# Patient Record
Sex: Male | Born: 2000 | Race: Black or African American | Hispanic: No | Marital: Single | Smoking: Never smoker
Health system: Southern US, Community
[De-identification: ages and names within clinical notes are randomized; demographics above are authoritative.]

## PROBLEM LIST (undated history)

## (undated) DIAGNOSIS — Z8619 Personal history of other infectious and parasitic diseases: Secondary | ICD-10-CM

## (undated) DIAGNOSIS — I370 Nonrheumatic pulmonary valve stenosis: Secondary | ICD-10-CM

## (undated) DIAGNOSIS — E669 Obesity, unspecified: Secondary | ICD-10-CM

## (undated) DIAGNOSIS — G40A09 Absence epileptic syndrome, not intractable, without status epilepticus: Secondary | ICD-10-CM

## (undated) DIAGNOSIS — R569 Unspecified convulsions: Secondary | ICD-10-CM

## (undated) DIAGNOSIS — J309 Allergic rhinitis, unspecified: Secondary | ICD-10-CM

## (undated) HISTORY — DX: Obesity, unspecified: E66.9

## (undated) HISTORY — DX: Nonrheumatic pulmonary valve stenosis: I37.0

## (undated) HISTORY — DX: Absence epileptic syndrome, not intractable, without status epilepticus: G40.A09

## (undated) HISTORY — DX: Allergic rhinitis, unspecified: J30.9

## (undated) HISTORY — DX: Personal history of other infectious and parasitic diseases: Z86.19

---
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
## 2020-10-07 ENCOUNTER — Ambulatory Visit (HOSPITAL_COMMUNITY)
Admission: EM | Admit: 2020-10-07 | Discharge: 2020-10-07 | Disposition: A | Discharge status: Home / Self Care | Payer: Managed Care, Other (non HMO) | Attending: Family Medicine | Admitting: Family Medicine

## 2020-10-07 ENCOUNTER — Other Ambulatory Visit: Payer: Self-pay

## 2020-10-07 DIAGNOSIS — J069 Acute upper respiratory infection, unspecified: Secondary | ICD-10-CM | POA: Diagnosis not present

## 2020-10-07 DIAGNOSIS — X58XXXA Exposure to other specified factors, initial encounter: Secondary | ICD-10-CM | POA: Diagnosis not present

## 2020-10-07 DIAGNOSIS — Z20822 Contact with and (suspected) exposure to covid-19: Secondary | ICD-10-CM | POA: Insufficient documentation

## 2020-10-07 DIAGNOSIS — S00522A Blister (nonthermal) of oral cavity, initial encounter: Secondary | ICD-10-CM

## 2020-10-07 LAB — CBC WITH DIFFERENTIAL/PLATELET
Abs Immature Granulocytes: 0.01 10*3/uL (ref 0.00–0.07)
Basophils Absolute: 0 10*3/uL (ref 0.0–0.1)
Basophils Relative: 1 %
Eosinophils Absolute: 0 10*3/uL (ref 0.0–0.5)
Eosinophils Relative: 1 %
HCT: 44.8 % (ref 39.0–52.0)
Hemoglobin: 15 g/dL (ref 13.0–17.0)
Immature Granulocytes: 0 %
Lymphocytes Relative: 25 %
Lymphs Abs: 0.9 10*3/uL (ref 0.7–4.0)
MCH: 29.6 pg (ref 26.0–34.0)
MCHC: 33.5 g/dL (ref 30.0–36.0)
MCV: 88.5 fL (ref 80.0–100.0)
Monocytes Absolute: 0.6 10*3/uL (ref 0.1–1.0)
Monocytes Relative: 16 %
Neutro Abs: 2.1 10*3/uL (ref 1.7–7.7)
Neutrophils Relative %: 57 %
Platelets: 159 10*3/uL (ref 150–400)
RBC: 5.06 MIL/uL (ref 4.22–5.81)
RDW: 12.7 % (ref 11.5–15.5)
WBC: 3.7 10*3/uL — ABNORMAL LOW (ref 4.0–10.5)
nRBC: 0 % (ref 0.0–0.2)

## 2020-10-07 LAB — COMPREHENSIVE METABOLIC PANEL
ALT: 14 U/L (ref 0–44)
AST: 21 U/L (ref 15–41)
Albumin: 3.9 g/dL (ref 3.5–5.0)
Alkaline Phosphatase: 34 U/L — ABNORMAL LOW (ref 38–126)
Anion gap: 10 (ref 5–15)
BUN: 8 mg/dL (ref 6–20)
CO2: 27 mmol/L (ref 22–32)
Calcium: 9 mg/dL (ref 8.9–10.3)
Chloride: 96 mmol/L — ABNORMAL LOW (ref 98–111)
Creatinine, Ser: 1.19 mg/dL (ref 0.61–1.24)
GFR, Estimated: >60 mL/min (ref >60)
Glucose, Bld: 86 mg/dL (ref 70–99)
Potassium: 3.9 mmol/L (ref 3.5–5.1)
Sodium: 133 mmol/L — ABNORMAL LOW (ref 135–145)
Total Bilirubin: 0.8 mg/dL (ref 0.3–1.2)
Total Protein: 7.5 g/dL (ref 6.5–8.1)

## 2020-10-07 MED ORDER — LIDOCAINE VISCOUS HCL 2 % MT SOLN: 10.0000 mL | OROMUCOSAL | 0 refills | Status: AC | PRN

## 2020-10-07 MED ORDER — PROMETHAZINE-DM 6.25-15 MG/5ML PO SYRP: 5.0000 mL | ORAL_SOLUTION | Freq: Four times a day (QID) | ORAL | 0 refills | Status: AC | PRN

## 2020-10-07 MED ORDER — MUPIROCIN 2 % EX OINT: 1.0000 "application " | TOPICAL_OINTMENT | Freq: Two times a day (BID) | CUTANEOUS | 0 refills | Status: AC

## 2020-10-07 NOTE — ED Triage Notes (Signed)
Pt is present today with mouth blisters, body aches, and a cough. Pt states that his sx started Saturday. Pt states that he tested negative yesterday morning.

## 2020-10-08 LAB — SARS CORONAVIRUS 2 (TAT 6-24 HRS): SARS Coronavirus 2: NEGATIVE

## 2020-10-09 NOTE — ED Provider Notes (Signed)
[facility]    CSN: [id number] Arrival date & time: 10/07/20  1324      History   Chief Complaint Chief Complaint  Patient presents with  . Cough  . Generalized Body Aches  . Mouth Lesions    HPI Adrian Conner is a 20 y.o. male.   Patient presenting today with 4-day history of body aches, cough, congestion, scratchy throat, now today having blisters from around his mouth that are sore.  He denies chest pain, shortness of breath, abdominal pain, nausea vomiting or diarrhea, headache, dizziness.  Was on vacation in [place] and symptoms started, try to stay well-hydrated, took some over-the-counter cold medication with only mild temporary relief.  COVID test yesterday was negative.  Unaware of any sick contacts that he was around.  No pertinent past medical history.      Past Medical History:  Diagnosis Date  . Allergic rhinitis   . Epilepsy, absence (HCC)   . History of cold sores 2009  . Obesity   . Pulmonic valve stenosis    referred to cardiology for follow up in 2013 due to S4 heart sound.   . Seizures (HCC)    absence epilpesy    Patient Active Problem List   Diagnosis Date Noted  . 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 Acne 06/17/2014  . Seizures (HCC)     No past surgical history on file.     Home Medications    Prior to Admission medications   Medication Sig Start Date End Date Taking? Authorizing Provider  lidocaine (XYLOCAINE) 2 % solution Use as directed 10 mLs in the mouth or throat as needed for mouth pain. 10/07/20  Yes Particia Nearing, PA-C  mupirocin ointment (BACTROBAN) 2 % Apply 1 application topically 2 (two) times daily. 10/07/20  Yes Particia Nearing, PA-C  promethazine-dextromethorphan (PROMETHAZINE-DM) 6.25-15 MG/5ML syrup Take 5 mLs by mouth 4 (four) times daily as needed for cough. 10/07/20  Yes Particia Nearing, PA-C  Cholecalciferol (VITAMIN D3) 2000 units capsule Take by mouth. 09/19/17   [provider]  divalproex (DEPAKOTE ER) 500 MG 24 hr tablet Take by mouth. 09/19/17   [provider]    Family History No family history on file.  Social History Social History   Tobacco Use  . Smoking status: Never Smoker  . Smokeless tobacco: Never Used  Substance Use Topics  . Alcohol use: No  . Drug use: No     Allergies   Patient has no known allergies.   Review of Systems Review of Systems Per HPI  Physical Exam Triage Vital Signs ED Triage Vitals  Enc Vitals Group     BP 10/07/20 1507 136/84     Pulse Rate 10/07/20 1507 87     Resp 10/07/20 1507 18     Temp 10/07/20 1507 99.7 F (37.6 C)     Temp Source 10/07/20 1507 Oral     SpO2 10/07/20 1507 100 %     Weight --      Height --      Head Circumference --      Peak Flow --      Pain Score 10/07/20 1503 0     Pain Loc --      Pain Edu? --      Excl. in GC? --    No data found.  Updated Vital Signs BP 136/84   Pulse 87   Temp 99.7 F (37.6 C) (Oral)   Resp 18  SpO2 100%   Visual Acuity Right Eye Distance:   Left Eye Distance:   Bilateral Distance:    Right Eye Near:   Left Eye Near:    Bilateral Near:     Physical Exam Vitals and nursing note reviewed.  Constitutional:      Appearance: Normal appearance.  HENT:     Head: Atraumatic.     Right Ear: Tympanic membrane normal.     Left Ear: Tympanic membrane normal.     Nose: Rhinorrhea present.     Mouth/Throat:     Mouth: Mucous membranes are moist.     Pharynx: Posterior oropharyngeal erythema present.     Comments: No tonsillar edema or exudates, uvula midline, possibly some inflamed patches on tongue but no obvious lesions on tongue or oral mucosa.  Large areas of papular blistering surrounding lips.  No active drainage Eyes:     Extraocular Movements: Extraocular movements intact.     Conjunctiva/sclera: Conjunctivae normal.  Cardiovascular:     Rate and Rhythm: Normal rate and regular rhythm.  Pulmonary:      Effort: Pulmonary effort is normal. No respiratory distress.     Breath sounds: Normal breath sounds. No wheezing or rales.  Abdominal:     General: Bowel sounds are normal. There is no distension.     Palpations: Abdomen is soft.     Tenderness: There is no abdominal tenderness. There is no right CVA tenderness, left CVA tenderness or guarding.  Musculoskeletal:        General: Normal range of motion.     Cervical back: Normal range of motion and neck supple.  Skin:    General: Skin is warm and dry.     Findings: Rash present.     Comments: Blistering around lips  Neurological:     General: No focal deficit present.     Mental Status: He is oriented to person, place, and time.  Psychiatric:        Mood and Affect: Mood normal.        Thought Content: Thought content normal.        Judgment: Judgment normal.    UC Treatments / Results  Labs (all labs ordered are listed, but only abnormal results are displayed) Labs Reviewed  CBC WITH DIFFERENTIAL/PLATELET - Abnormal; Notable for the following components:      Result Value   WBC 3.7 (*)    All other components within normal limits  COMPREHENSIVE METABOLIC PANEL - Abnormal; Notable for the following components:   Sodium 133 (*)    Chloride 96 (*)    Alkaline Phosphatase 34 (*)    All other components within normal limits  SARS CORONAVIRUS 2 (TAT 6-24 HRS)  RESPIRATORY PANEL BY PCR    EKG   Radiology No results found.  Procedures Procedures (including critical care time)  Medications Ordered in UC Medications - No data to display  Initial Impression / Assessment and Plan / UC Course  I have reviewed the triage vital signs and the nursing notes.  Pertinent labs & imaging results that were available during my care of the patient were reviewed by me and considered in my medical decision making (see chart for details).     Patient requesting significant testing including full panel of blood work, GI and respiratory  conditions.  Discussed some basic blood work, respiratory panel testing but that GI panel was not indicated as his symptoms had not been going on for more than 1 week and  were stable appearing.  Respiratory panel and basic labs pending, discussed trial of viscous lidocaine for oral lesions, mupirocin ointment to the external blistering around lips, Phenergan DM for cough in addition to Mucinex, DayQuil, NyQuil, other supportive medications.  Home care and strict return precautions reviewed.  Discussed isolation protocol.  Final Clinical Impressions(s) / UC Diagnoses   Final diagnoses:  Viral URI with cough  Non-thermal blister of oral cavity, initial encounter   Discharge Instructions   None    ED Prescriptions    Medication Sig Dispense Auth. Provider   promethazine-dextromethorphan (PROMETHAZINE-DM) 6.25-15 MG/5ML syrup Take 5 mLs by mouth 4 (four) times daily as needed for cough. 100 mL Particia Nearing, PA-C   lidocaine (XYLOCAINE) 2 % solution Use as directed 10 mLs in the mouth or throat as needed for mouth pain. 100 mL Particia Nearing, PA-C   mupirocin ointment (BACTROBAN) 2 % Apply 1 application topically 2 (two) times daily. 22 g Particia Nearing, [place]     PDMP not reviewed this encounter.   Roosvelt Maser [place], [place] 10/09/20 [phone]

## 2020-10-10 ENCOUNTER — Ambulatory Visit
Admission: EM | Admit: 2020-10-10 | Discharge: 2020-10-10 | Disposition: A | Discharge status: Home / Self Care | Payer: Managed Care, Other (non HMO) | Attending: Emergency Medicine | Admitting: Emergency Medicine

## 2020-10-10 ENCOUNTER — Telehealth: Payer: Self-pay | Admitting: Emergency Medicine

## 2020-10-10 ENCOUNTER — Encounter: Payer: Self-pay | Admitting: Emergency Medicine

## 2020-10-10 ENCOUNTER — Other Ambulatory Visit: Payer: Self-pay

## 2020-10-10 DIAGNOSIS — K146 Glossodynia: Secondary | ICD-10-CM

## 2020-10-10 DIAGNOSIS — B001 Herpesviral vesicular dermatitis: Secondary | ICD-10-CM

## 2020-10-10 MED ORDER — VALACYCLOVIR HCL 1 G PO TABS: 1000.0000 mg | ORAL_TABLET | Freq: Three times a day (TID) | ORAL | 0 refills | Status: DC

## 2020-10-10 MED ORDER — IBUPROFEN 800 MG PO TABS: 800.0000 mg | ORAL_TABLET | Freq: Three times a day (TID) | ORAL | 0 refills | Status: AC

## 2020-10-10 MED ORDER — IBUPROFEN 800 MG PO TABS: 800.0000 mg | ORAL_TABLET | Freq: Three times a day (TID) | ORAL | 0 refills | Status: DC

## 2020-10-10 MED ORDER — NYSTATIN 100000 UNIT/ML MT SUSP: 10.0000 mL | Freq: Four times a day (QID) | OROMUCOSAL | 0 refills | Status: AC | PRN

## 2020-10-10 NOTE — ED Triage Notes (Signed)
Pt here for mouth blisters  X 5 days that are painful; pt seen on 5/25 for same

## 2020-10-10 NOTE — Discharge Instructions (Addendum)
I suspect the sores in her mouth are likely viral, begin Valtrex 3 times daily over the next 7 to 10 days Please use Magic mouthwash in the meantime to help with discomfort, swish and gargle then spit out Tylenol and ibuprofen for pain Follow-up if not improving or worsening

## 2020-10-10 NOTE — ED Provider Notes (Signed)
[facility]    CSN: [id number] Arrival date & time: 10/10/20  1028      History   Chief Complaint Chief Complaint  Patient presents with  . Blister    HPI Stepfon Rawles is a 20 y.o. male presenting today for evaluation of blisters to tongue.  Reports over the past 5 days has had painful sores to lips and tongue, lesions on lips are improving, but reports persistent pain and discomfort to tongue.  Has had decreased oral intake due to this.  Has been using viscous lidocaine and bacitracin without relief.  Reports history of similar cold sores on lips, but denies lesions on the tongue.  HPI  Past Medical History:  Diagnosis Date  . Allergic rhinitis   . Epilepsy, absence (HCC)   . History of cold sores 2009  . Obesity   . Pulmonic valve stenosis    referred to cardiology for follow up in 2013 due to S4 heart sound.   . Seizures (HCC)    absence epilpesy    Patient Active Problem List   Diagnosis Date Noted  . 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 Acne 06/17/2014  . Seizures (HCC)     History reviewed. No pertinent surgical history.     Home Medications    Prior to Admission medications   Medication Sig Start Date End Date Taking? Authorizing Provider  ibuprofen (ADVIL) 800 MG tablet Take 1 tablet (800 mg total) by mouth 3 (three) times daily. 10/10/20  Yes Adella Manolis C, PA-C  magic mouthwash (nystatin, lidocaine, diphenhydrAMINE, alum & mag hydroxide) suspension Swish and spit 10 mLs 4 (four) times daily as needed for mouth pain. 10/10/20  Yes Ambria Mayfield C, PA-C  valACYclovir (VALTREX) 1000 MG tablet Take 1 tablet (1,000 mg total) by mouth 3 (three) times daily for 10 days. 10/10/20 10/20/20 Yes Reilynn Lauro C, PA-C  Cholecalciferol (VITAMIN D3) 2000 units capsule Take by mouth. 09/19/17   [provider]  divalproex (DEPAKOTE ER) 500 MG 24 hr tablet Take by mouth. 09/19/17   [provider]  lidocaine (XYLOCAINE) 2 % solution  Use as directed 10 mLs in the mouth or throat as needed for mouth pain. 10/07/20   Particia Nearing, PA-C  mupirocin ointment (BACTROBAN) 2 % Apply 1 application topically 2 (two) times daily. 10/07/20   Particia Nearing, PA-C  promethazine-dextromethorphan (PROMETHAZINE-DM) 6.25-15 MG/5ML syrup Take 5 mLs by mouth 4 (four) times daily as needed for cough. 10/07/20   Particia Nearing, PA-C    Family History History reviewed. No pertinent family history.  Social History Social History   Tobacco Use  . Smoking status: Never Smoker  . Smokeless tobacco: Never Used  Substance Use Topics  . Alcohol use: No  . Drug use: No     Allergies   Patient has no known allergies.   Review of Systems Review of Systems  Constitutional: Negative for fatigue and fever.  HENT: Positive for mouth sores.   Eyes: Negative for redness, itching and visual disturbance.  Respiratory: Negative for shortness of breath.   Cardiovascular: Negative for chest pain and leg swelling.  Gastrointestinal: Negative for nausea and vomiting.  Musculoskeletal: Negative for arthralgias and myalgias.  Skin: Positive for color change and rash. Negative for wound.  Neurological: Negative for dizziness, syncope, weakness, light-headedness and headaches.     Physical Exam Triage Vital Signs ED Triage Vitals  Enc Vitals Group     BP      Pulse  Resp      Temp      Temp src      SpO2      Weight      Height      Head Circumference      Peak Flow      Pain Score      Pain Loc      Pain Edu?      Excl. in GC?    No data found.  Updated Vital Signs BP 109/76 (BP Location: Left Arm)   Pulse 66   Temp 98.1 F (36.7 C) (Oral)   Resp 18   SpO2 98%   Visual Acuity Right Eye Distance:   Left Eye Distance:   Bilateral Distance:    Right Eye Near:   Left Eye Near:    Bilateral Near:     Physical Exam Vitals and nursing note reviewed.  Constitutional:      Appearance: He is  well-developed.     Comments: No acute distress  HENT:     Head: Normocephalic and atraumatic.     Nose: Nose normal.     Mouth/Throat:     Comments: Lips with various areas of open sores noted to creases  Tongue with deep erythema, various sores/ulcerative lesions noted and white patches across tongue, other oral mucosa without erythema or white patches Eyes:     Conjunctiva/sclera: Conjunctivae normal.  Cardiovascular:     Rate and Rhythm: Normal rate.  Pulmonary:     Effort: Pulmonary effort is normal. No respiratory distress.  Abdominal:     General: There is no distension.  Musculoskeletal:        General: Normal range of motion.     Cervical back: Neck supple.  Skin:    General: Skin is warm and dry.  Neurological:     Mental Status: He is alert and oriented to person, place, and time.      UC Treatments / Results  Labs (all labs ordered are listed, but only abnormal results are displayed) Labs Reviewed - No data to display  EKG   Radiology No results found.  Procedures Procedures (including critical care time)  Medications Ordered in UC Medications - No data to display  Initial Impression / Assessment and Plan / UC Course  I have reviewed the triage vital signs and the nursing notes.  Pertinent labs & imaging results that were available during my care of the patient were reviewed by me and considered in my medical decision making (see chart for details).     Given sores on lips as well as tongue suspect most likely viral, opting to initiate on Valtrex, providing Magic mouthwash to help with discomfort in the meantime.  Seems less likely suggestive of thrush given no involvement of other oral mucosa and isolated to tongue at this time.  Continue to monitor.  Discussed strict return precautions. Patient verbalized understanding and is agreeable with plan.  Final Clinical Impressions(s) / UC Diagnoses   Final diagnoses:  Tongue sore  Cold sore      Discharge Instructions     I suspect the sores in her mouth are likely viral, begin Valtrex 3 times daily over the next 7 to 10 days Please use Magic mouthwash in the meantime to help with discomfort, swish and gargle then spit out Tylenol and ibuprofen for pain Follow-up if not improving or worsening     ED Prescriptions    Medication Sig Dispense Auth. Provider   valACYclovir (VALTREX)  1000 MG tablet Take 1 tablet (1,000 mg total) by mouth 3 (three) times daily for 10 days. 30 tablet Brittony Billick, [place] C, PA-C   magic mouthwash (nystatin, lidocaine, diphenhydrAMINE, alum & mag hydroxide) suspension Swish and spit 10 mLs 4 (four) times daily as needed for mouth pain. 360 mL Hagen Tidd C, PA-C   ibuprofen (ADVIL) 800 MG tablet Take 1 tablet (800 mg total) by mouth 3 (three) times daily. 21 tablet Shayona Hibbitts, Raymond C, PA-C     PDMP not reviewed this encounter.   Lew Dawes, [place] 10/10/20 1238

## 2020-10-12 ENCOUNTER — Telehealth: Payer: Self-pay | Admitting: Physician Assistant

## 2020-10-12 MED ORDER — VALACYCLOVIR HCL 1 G PO TABS: 1000.0000 mg | ORAL_TABLET | Freq: Three times a day (TID) | ORAL | 0 refills | Status: AC

## 2020-10-12 NOTE — Telephone Encounter (Signed)
Pt reports he was unable to get the prescription that was sent in.  Requesting a paper copy.

## 2020-10-12 NOTE — Telephone Encounter (Signed)
Patient reports making repeated trips to get medication:valtrex.  Patient is here today, pharmacy saying they do not have script.  Spoke to [place], pa.  Chart reviewed.  Patient requested a hard copy script.  Shanda Bumps, pa provided printed script.  Patient content when leaving lobby

## 2021-03-16 ENCOUNTER — Ambulatory Visit (INDEPENDENT_AMBULATORY_CARE_PROVIDER_SITE_OTHER): Payer: Self-pay | Admitting: Family Medicine

## 2021-03-16 ENCOUNTER — Other Ambulatory Visit: Payer: Self-pay

## 2021-03-16 ENCOUNTER — Encounter: Payer: Self-pay | Admitting: Family Medicine

## 2021-03-16 VITALS — BP 142/75 | HR 71 | Temp 98.3°F | Resp 16 | Ht 69.0 in | Wt 174.2 lb

## 2021-03-16 DIAGNOSIS — Z7689 Persons encountering health services in other specified circumstances: Secondary | ICD-10-CM

## 2021-03-16 DIAGNOSIS — Z0000 Encounter for general adult medical examination without abnormal findings: Secondary | ICD-10-CM

## 2021-03-16 DIAGNOSIS — R030 Elevated blood-pressure reading, without diagnosis of hypertension: Secondary | ICD-10-CM

## 2021-03-16 NOTE — Progress Notes (Signed)
New Patient Office Visit  Subjective:  Patient ID: Adrian Conner, male    DOB: 2001-01-17  Age: 20 y.o. MRN: [id number]  CC:  Chief Complaint  Patient presents with   Establish Care    HPI Rohail Klees presents for establish care for routine annual exam. Denies acute complaints or concerns.   Past Medical History:  Diagnosis Date   Allergic rhinitis    Epilepsy, absence ([place])    History of cold sores 2009   Obesity    Pulmonic valve stenosis    referred to cardiology for follow up in 2013 due to S4 heart sound.    Seizures ([place])    absence epilpesy    No past surgical history on file.  No family history on file.  Social History   Socioeconomic History   Marital status: Single    Spouse name: Not on file   Number of children: Not on file   Years of education: Not on file   Highest education level: Not on file  Occupational History   Not on file  Tobacco Use   Smoking status: Never   Smokeless tobacco: Never  Substance and Sexual Activity   Alcohol use: No   Drug use: No   Sexual activity: Not on file  Other Topics Concern   Not on file  Social History Narrative   Lives with his parents and siblings. He is entering [employer] for fall 2019.   Social Determinants of Health   Financial Resource Strain: Not on file  Food Insecurity: Not on file  Transportation Needs: Not on file  Physical Activity: Not on file  Stress: Not on file  Social Connections: Not on file  Intimate Partner Violence: Not on file    ROS Review of Systems  All other systems reviewed and are negative.  Objective:   Today's Vitals: BP (!) 142/75   Pulse 71   Temp 98.3 F (36.8 C) (Oral)   Resp 16   Ht 5' 9" (1.753 m)   Wt 174 lb 3.2 oz (79 kg)   SpO2 95%   BMI 25.72 kg/m   Physical Exam Vitals and nursing note reviewed.  Constitutional:      General: He is not in acute distress. HENT:     Head: Normocephalic and atraumatic.     Right Ear: Tympanic  membrane, ear canal and external ear normal.     Left Ear: Tympanic membrane, ear canal and external ear normal.     Nose: Nose normal.     Mouth/Throat:     Mouth: Mucous membranes are moist.     Pharynx: Oropharynx is clear.  Eyes:     Conjunctiva/sclera: Conjunctivae normal.     Pupils: Pupils are equal, round, and reactive to light.  Neck:     Thyroid: No thyromegaly.  Cardiovascular:     Rate and Rhythm: Normal rate and regular rhythm.     Heart sounds: Normal heart sounds. No murmur heard. Pulmonary:     Effort: Pulmonary effort is normal.     Breath sounds: Normal breath sounds.  Abdominal:     General: There is no distension.     Palpations: Abdomen is soft. There is no mass.     Tenderness: There is no abdominal tenderness.     Hernia: There is no hernia in the left inguinal area or right inguinal area.  Musculoskeletal:        General: Normal range of motion.     Cervical back: Normal  range of motion and neck supple.     Right lower leg: No edema.     Left lower leg: No edema.  Skin:    General: Skin is warm and dry.  Neurological:     General: No focal deficit present.     Mental Status: He is alert and oriented to person, place, and time. Mental status is at baseline.  Psychiatric:        Mood and Affect: Mood normal.        Behavior: Behavior normal.    Assessment & Plan:  1. Visit for well man health check Unremarkable exam. Routine labs ordered - CMP14+EGFR - CBC with Differential  2. Elevated blood-pressure reading without diagnosis of hypertension Will monitor  3. Encounter to establish care    Outpatient Encounter Medications as of 03/16/2021  Medication Sig   Cholecalciferol (VITAMIN D3) 2000 units capsule Take by mouth.   divalproex (DEPAKOTE ER) 500 MG 24 hr tablet Take by mouth.   divalproex (DEPAKOTE ER) 500 MG 24 hr tablet Take by mouth. (Patient not taking: Reported on 03/16/2021)   ibuprofen (ADVIL) 800 MG tablet Take 1 tablet (800 mg  total) by mouth 3 (three) times daily. (Patient not taking: Reported on 03/16/2021)   lidocaine (XYLOCAINE) 2 % solution Use as directed 10 mLs in the mouth or throat as needed for mouth pain. (Patient not taking: Reported on 03/16/2021)   magic mouthwash (nystatin, lidocaine, diphenhydrAMINE, alum & mag hydroxide) suspension Swish and spit 10 mLs 4 (four) times daily as needed for mouth pain. (Patient not taking: Reported on 03/16/2021)   mupirocin ointment (BACTROBAN) 2 % Apply 1 application topically 2 (two) times daily. (Patient not taking: Reported on 03/16/2021)   promethazine-dextromethorphan (PROMETHAZINE-DM) 6.25-15 MG/5ML syrup Take 5 mLs by mouth 4 (four) times daily as needed for cough. (Patient not taking: Reported on 03/16/2021)   No facility-administered encounter medications on file as of 03/16/2021.    Follow-up: No follow-ups on file.   Becky Sax, MD

## 2021-03-16 NOTE — Progress Notes (Signed)
Patient is here to establish care.  Patient would like to talk about medication for when he goes out of town in December

## 2021-03-17 LAB — CBC WITH DIFFERENTIAL/PLATELET
Basophils Absolute: 0 10*3/uL (ref 0.0–0.2)
Basos: 1 %
EOS (ABSOLUTE): 0 10*3/uL (ref 0.0–0.4)
Eos: 0 %
Hematocrit: 45 % (ref 37.5–51.0)
Hemoglobin: 15.2 g/dL (ref 13.0–17.7)
Immature Grans (Abs): 0 10*3/uL (ref 0.0–0.1)
Immature Granulocytes: 0 %
Lymphocytes Absolute: 1 10*3/uL (ref 0.7–3.1)
Lymphs: 36 %
MCH: 28.1 pg (ref 26.6–33.0)
MCHC: 33.8 g/dL (ref 31.5–35.7)
MCV: 83 fL (ref 79–97)
Monocytes Absolute: 0.2 10*3/uL (ref 0.1–0.9)
Monocytes: 9 %
Neutrophils Absolute: 1.5 10*3/uL (ref 1.4–7.0)
Neutrophils: 54 %
Platelets: 178 10*3/uL (ref 150–450)
RBC: 5.4 x10E6/uL (ref 4.14–5.80)
RDW: 13.1 % (ref 11.6–15.4)
WBC: 2.8 10*3/uL — ABNORMAL LOW (ref 3.4–10.8)

## 2021-03-17 LAB — CMP14+EGFR
ALT: 8 IU/L (ref 0–44)
AST: 13 IU/L (ref 0–40)
Albumin/Globulin Ratio: 1.9 (ref 1.2–2.2)
Albumin: 5.2 g/dL (ref 4.1–5.2)
Alkaline Phosphatase: 50 IU/L (ref 44–121)
BUN/Creatinine Ratio: 10 (ref 9–20)
BUN: 12 mg/dL (ref 6–20)
Bilirubin Total: 0.7 mg/dL (ref 0.0–1.2)
CO2: 28 mmol/L (ref 20–29)
Calcium: 10 mg/dL (ref 8.7–10.2)
Chloride: 98 mmol/L (ref 96–106)
Creatinine, Ser: 1.18 mg/dL (ref 0.76–1.27)
Globulin, Total: 2.7 g/dL (ref 1.5–4.5)
Glucose: 81 mg/dL (ref 70–99)
Potassium: 4.5 mmol/L (ref 3.5–5.2)
Sodium: 139 mmol/L (ref 134–144)
Total Protein: 7.9 g/dL (ref 6.0–8.5)
eGFR: 89 mL/min/{1.73_m2} (ref >59)
